# Patient Record
Sex: Male | Born: 1990 | Race: Black or African American | Hispanic: No | Marital: Single | Smoking: Current some day smoker
Health system: Southern US, Community
[De-identification: ages and names within clinical notes are randomized; demographics above are authoritative.]

## PROBLEM LIST (undated history)

## (undated) DIAGNOSIS — J302 Other seasonal allergic rhinitis: Secondary | ICD-10-CM

## (undated) HISTORY — PX: ANTERIOR CRUCIATE LIGAMENT REPAIR: SHX115

---
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
## 2013-03-23 ENCOUNTER — Encounter (HOSPITAL_COMMUNITY): Payer: Self-pay | Admitting: Emergency Medicine

## 2013-03-23 ENCOUNTER — Emergency Department (HOSPITAL_COMMUNITY)
Admission: EM | Admit: 2013-03-23 | Discharge: 2013-03-23 | Disposition: A | Discharge status: Home / Self Care | Payer: BC Managed Care – PPO | Attending: Emergency Medicine | Admitting: Emergency Medicine

## 2013-03-23 DIAGNOSIS — R252 Cramp and spasm: Secondary | ICD-10-CM

## 2013-03-23 DIAGNOSIS — F17200 Nicotine dependence, unspecified, uncomplicated: Secondary | ICD-10-CM | POA: Insufficient documentation

## 2013-03-23 DIAGNOSIS — J309 Allergic rhinitis, unspecified: Secondary | ICD-10-CM | POA: Insufficient documentation

## 2013-03-23 DIAGNOSIS — Z881 Allergy status to other antibiotic agents status: Secondary | ICD-10-CM | POA: Insufficient documentation

## 2013-03-23 DIAGNOSIS — M62838 Other muscle spasm: Secondary | ICD-10-CM | POA: Insufficient documentation

## 2013-03-23 MED ORDER — CYCLOBENZAPRINE HCL 10 MG PO TABS: 10.0000 mg | ORAL_TABLET | Freq: Two times a day (BID) | ORAL | Status: DC | PRN

## 2013-03-23 NOTE — ED Notes (Signed)
Discharge instructions reviewed. Pt verbalized understanding.  
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
## 2013-03-23 NOTE — ED Notes (Signed)
Pt denies any c/o at this time except leg heaviness. Pt alert and oriented.
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
## 2013-03-24 NOTE — ED Provider Notes (Signed)
Medical screening examination/treatment/procedure(s) were performed by non-physician practitioner and as supervising physician I was immediately available for consultation/collaboration.   Gavin Pound. Zafira Munos, MD 03/24/13 1116

## 2013-06-14 ENCOUNTER — Encounter (HOSPITAL_COMMUNITY): Payer: Self-pay | Admitting: Emergency Medicine

## 2013-06-14 ENCOUNTER — Emergency Department (HOSPITAL_COMMUNITY)
Admission: EM | Admit: 2013-06-14 | Discharge: 2013-06-14 | Disposition: A | Discharge status: Home / Self Care | Payer: BC Managed Care – PPO | Attending: Emergency Medicine | Admitting: Emergency Medicine

## 2013-06-14 ENCOUNTER — Emergency Department (HOSPITAL_COMMUNITY): Payer: BC Managed Care – PPO

## 2013-06-14 DIAGNOSIS — Y939 Activity, unspecified: Secondary | ICD-10-CM | POA: Insufficient documentation

## 2013-06-14 DIAGNOSIS — S0100XA Unspecified open wound of scalp, initial encounter: Secondary | ICD-10-CM | POA: Insufficient documentation

## 2013-06-14 DIAGNOSIS — R040 Epistaxis: Secondary | ICD-10-CM | POA: Insufficient documentation

## 2013-06-14 DIAGNOSIS — Y929 Unspecified place or not applicable: Secondary | ICD-10-CM | POA: Insufficient documentation

## 2013-06-14 DIAGNOSIS — Z23 Encounter for immunization: Secondary | ICD-10-CM | POA: Insufficient documentation

## 2013-06-14 DIAGNOSIS — R51 Headache: Secondary | ICD-10-CM | POA: Insufficient documentation

## 2013-06-14 DIAGNOSIS — F1010 Alcohol abuse, uncomplicated: Secondary | ICD-10-CM | POA: Insufficient documentation

## 2013-06-14 DIAGNOSIS — Z881 Allergy status to other antibiotic agents status: Secondary | ICD-10-CM | POA: Insufficient documentation

## 2013-06-14 DIAGNOSIS — F17200 Nicotine dependence, unspecified, uncomplicated: Secondary | ICD-10-CM | POA: Insufficient documentation

## 2013-06-14 DIAGNOSIS — S139XXA Sprain of joints and ligaments of unspecified parts of neck, initial encounter: Secondary | ICD-10-CM | POA: Insufficient documentation

## 2013-06-14 DIAGNOSIS — W268XXA Contact with other sharp object(s), not elsewhere classified, initial encounter: Secondary | ICD-10-CM | POA: Insufficient documentation

## 2013-06-14 DIAGNOSIS — J309 Allergic rhinitis, unspecified: Secondary | ICD-10-CM | POA: Insufficient documentation

## 2013-06-14 DIAGNOSIS — R413 Other amnesia: Secondary | ICD-10-CM | POA: Insufficient documentation

## 2013-06-14 DIAGNOSIS — T148XXA Other injury of unspecified body region, initial encounter: Secondary | ICD-10-CM | POA: Insufficient documentation

## 2013-06-14 DIAGNOSIS — R110 Nausea: Secondary | ICD-10-CM | POA: Insufficient documentation

## 2013-06-14 DIAGNOSIS — S060X0A Concussion without loss of consciousness, initial encounter: Secondary | ICD-10-CM | POA: Insufficient documentation

## 2013-06-14 DIAGNOSIS — F05 Delirium due to known physiological condition: Secondary | ICD-10-CM | POA: Insufficient documentation

## 2013-06-14 LAB — BASIC METABOLIC PANEL
BUN: 10 mg/dL (ref 6–23)
CO2: 26 mEq/L (ref 19–32)
Calcium: 8.6 mg/dL (ref 8.4–10.5)
Chloride: 105 mEq/L (ref 96–112)
GFR calc non Af Amer: 71 mL/min — ABNORMAL LOW (ref >90)
Glucose, Bld: 98 mg/dL (ref 70–99)
Potassium: 3.4 mEq/L — ABNORMAL LOW (ref 3.5–5.1)
Sodium: 141 mEq/L (ref 135–145)

## 2013-06-14 LAB — CBC WITH DIFFERENTIAL/PLATELET
Basophils Absolute: 0 10*3/uL (ref 0.0–0.1)
Eosinophils Absolute: 0.1 10*3/uL (ref 0.0–0.7)
Lymphs Abs: 2.6 10*3/uL (ref 0.7–4.0)
Neutrophils Relative %: 41 % — ABNORMAL LOW (ref 43–77)

## 2013-06-14 LAB — PROTIME-INR: INR: 1.05 (ref 0.00–1.49)

## 2013-06-14 MED ORDER — FENTANYL CITRATE 0.05 MG/ML IJ SOLN
INTRAMUSCULAR | Status: AC
  Filled 2013-06-14: qty 2

## 2013-06-14 MED ORDER — ONDANSETRON HCL 4 MG/2ML IJ SOLN
4.0000 mg | Freq: Once | INTRAMUSCULAR | Status: AC
  Administered 2013-06-14: 4 mg via INTRAVENOUS

## 2013-06-14 MED ORDER — FENTANYL CITRATE 0.05 MG/ML IJ SOLN
50.0000 ug | Freq: Once | INTRAMUSCULAR | Status: AC
  Administered 2013-06-14: 50 ug via INTRAVENOUS

## 2013-06-14 MED ORDER — KETOROLAC TROMETHAMINE 30 MG/ML IJ SOLN
30.0000 mg | Freq: Once | INTRAMUSCULAR | Status: AC
  Administered 2013-06-14: 30 mg via INTRAVENOUS
  Filled 2013-06-14: qty 1

## 2013-06-14 MED ORDER — ONDANSETRON HCL 4 MG/2ML IJ SOLN
INTRAMUSCULAR | Status: AC
  Filled 2013-06-14: qty 2

## 2013-06-14 MED ORDER — IBUPROFEN 600 MG PO TABS: 600.0000 mg | ORAL_TABLET | Freq: Four times a day (QID) | ORAL | Status: DC | PRN

## 2013-06-14 MED ORDER — HYDROCODONE-ACETAMINOPHEN 5-325 MG PO TABS: 1.0000 | ORAL_TABLET | Freq: Four times a day (QID) | ORAL | Status: DC | PRN

## 2013-06-14 MED ORDER — SODIUM CHLORIDE 0.9 % IV BOLUS (SEPSIS)
1000.0000 mL | Freq: Once | INTRAVENOUS | Status: AC
  Administered 2013-06-14: 1000 mL via INTRAVENOUS

## 2013-06-14 MED ORDER — HYDROCODONE-ACETAMINOPHEN 5-325 MG PO TABS
1.0000 | ORAL_TABLET | Freq: Once | ORAL | Status: AC
  Administered 2013-06-14: 1 via ORAL
  Filled 2013-06-14: qty 1

## 2013-06-14 MED ORDER — METHOCARBAMOL 500 MG PO TABS: 500.0000 mg | ORAL_TABLET | Freq: Two times a day (BID) | ORAL | Status: DC

## 2013-06-14 MED ORDER — SODIUM CHLORIDE 0.9 % IV SOLN
Freq: Once | INTRAVENOUS | Status: AC
  Administered 2013-06-14: 05:00:00 via INTRAVENOUS

## 2013-06-14 MED ORDER — METOCLOPRAMIDE HCL 5 MG/ML IJ SOLN
10.0000 mg | Freq: Once | INTRAMUSCULAR | Status: AC
  Administered 2013-06-14: 10 mg via INTRAVENOUS
  Filled 2013-06-14: qty 2

## 2013-06-14 MED ORDER — TETANUS-DIPHTH-ACELL PERTUSSIS 5-2.5-18.5 LF-MCG/0.5 IM SUSP
0.5000 mL | Freq: Once | INTRAMUSCULAR | Status: AC
  Administered 2013-06-14: 0.5 mL via INTRAMUSCULAR
  Filled 2013-06-14: qty 0.5

## 2013-06-14 NOTE — ED Notes (Signed)
Nausea returning, meds given for pain and nausea, IVF KVO, VSS, lying flat, c-collar remains. No changes. Family at [facility].
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
## 2013-06-14 NOTE — ED Notes (Signed)
MD at bedside. Backboard removed.

## 2013-06-14 NOTE — ED Notes (Signed)
Dr. Rhunette Croft speaking with pt and pt's mother, updated. 3 staples placed to top of head.

## 2013-06-14 NOTE — ED Notes (Signed)
Mother and girlfriend at bedside.

## 2013-06-14 NOTE — ED Notes (Signed)
c-collar removed by Dr. Rhunette Croft, EDP at [facility], preparing to staple head.
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
## 2014-02-11 ENCOUNTER — Emergency Department (HOSPITAL_COMMUNITY)
Admission: EM | Admit: 2014-02-11 | Discharge: 2014-02-11 | Disposition: A | Discharge status: Home / Self Care | Payer: BC Managed Care – PPO | Attending: Emergency Medicine | Admitting: Emergency Medicine

## 2014-02-11 ENCOUNTER — Encounter (HOSPITAL_COMMUNITY): Payer: Self-pay | Admitting: Emergency Medicine

## 2014-02-11 DIAGNOSIS — H9209 Otalgia, unspecified ear: Secondary | ICD-10-CM | POA: Insufficient documentation

## 2014-02-11 DIAGNOSIS — F17200 Nicotine dependence, unspecified, uncomplicated: Secondary | ICD-10-CM | POA: Insufficient documentation

## 2014-02-11 DIAGNOSIS — Z880 Allergy status to penicillin: Secondary | ICD-10-CM | POA: Insufficient documentation

## 2014-02-11 DIAGNOSIS — Z79899 Other long term (current) drug therapy: Secondary | ICD-10-CM | POA: Insufficient documentation

## 2014-02-11 DIAGNOSIS — R599 Enlarged lymph nodes, unspecified: Secondary | ICD-10-CM | POA: Insufficient documentation

## 2014-02-11 DIAGNOSIS — R112 Nausea with vomiting, unspecified: Secondary | ICD-10-CM | POA: Insufficient documentation

## 2014-02-11 DIAGNOSIS — J029 Acute pharyngitis, unspecified: Secondary | ICD-10-CM | POA: Insufficient documentation

## 2014-02-11 LAB — RAPID STREP SCREEN (MED CTR MEBANE ONLY): Streptococcus, Group A Screen (Direct): NEGATIVE

## 2014-02-11 NOTE — ED Provider Notes (Signed)
CSN: [id number]     Arrival date & time 02/11/14  1903 History   First MD Initiated Contact with Patient 02/11/14 1912     Chief Complaint  Patient presents with  . Sore Throat    3 day hx of sore throat  . Lymphadenopathy    "swelling on r/side of neck"     (Consider location/radiation/quality/duration/timing/severity/associated sxs/prior Treatment) HPI Comments: Patrick Cole is a 23 y.o. Male with a PMHx of seasonal allergies, who presents with complaints of sore throat x3 days and R sided LAD. Pain is moderate, non-radiating, localized to R side of throat. Aggravated by swallowing and eating/drinking, better with salt water gargles and dayquil. Endorses some R ear discomfort, but no discharge or change in hearing. 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 +sick contacts, his roommate stated he was feeling ill last week. No recent travel. Smokes occasionally. Allergic to amoxicillin.  Patient is a 23 y.o. male presenting with pharyngitis. The history is provided by the patient. No language interpreter was used.  Sore Throat This is a new problem. The current episode started in the past 7 days. The problem occurs constantly. The problem has been unchanged. Associated symptoms include nausea (x1, after exertion), a sore throat, swollen glands and vomiting (x1, after exertion on Tuesday). Pertinent negatives include no abdominal pain, anorexia, arthralgias, chest pain, chills, congestion, coughing, fatigue, fever, headaches, joint swelling, myalgias, neck pain, numbness, rash, urinary symptoms or weakness. The symptoms are aggravated by drinking, eating and swallowing. Treatments tried: salt water gargles, dayquil. The treatment provided mild relief.    Past Medical History  Diagnosis Date  . Seasonal  allergies    Past Surgical History  Procedure Laterality Date  . Anterior cruciate ligament repair    . Anterior cruciate ligament repair     History reviewed. No pertinent family history. History  Substance Use Topics  . Smoking status: Current Some Day Smoker  . Smokeless tobacco: Not on file  . Alcohol Use: Yes    Review of Systems  Constitutional: Negative for fever, chills and fatigue.  HENT: Positive for ear pain and sore throat. Negative for congestion, drooling, ear discharge, hearing loss, mouth sores, nosebleeds, postnasal drip, rhinorrhea, sinus pressure, sneezing, tinnitus, trouble swallowing and voice change.   Eyes: Negative for pain, discharge, redness, itching and visual disturbance.  Respiratory: Negative for cough and shortness of breath.   Cardiovascular: Negative for chest pain.  Gastrointestinal: Positive for nausea (x1, after exertion) and vomiting (x1, after exertion on Tuesday). Negative for abdominal pain, diarrhea, constipation and anorexia.  Musculoskeletal: Negative for arthralgias, joint swelling, myalgias and neck pain.  Skin: Negative for rash.  Neurological: Negative for dizziness, weakness, numbness and headaches.      Allergies  Amoxicillin  Home Medications   Prior to Admission medications   Medication Sig Start Date End Date Taking? Authorizing Provider  HYDROcodone-acetaminophen (NORCO/VICODIN) 5-325 MG per tablet Take 1 tablet by mouth every 6 (six) hours as needed for pain. 06/14/13   Derwood Kaplan, MD  ibuprofen (ADVIL,MOTRIN) 600 MG tablet Take 1 tablet (600 mg total) by mouth every 6 (six) hours as needed for pain. 06/14/13   Derwood Kaplan, MD  methocarbamol (ROBAXIN) 500 MG tablet Take 1 tablet (500 mg total) by mouth 2 (two) times daily. 06/14/13   Derwood Kaplan, MD   BP 115/67  Pulse  60  Temp(Src) 99.1 F (37.3 C) (Oral)  Resp 16  Wt 207 lb (93.895 kg)  SpO2 100% Physical Exam  Nursing note and vitals  reviewed. Constitutional: He is oriented to person, place, and time. Vital signs are normal. He appears well-developed and well-nourished. No distress.  HENT:  Head: Normocephalic and atraumatic.  Right Ear: External ear and ear canal normal. No drainage. Tympanic membrane is not injected and not erythematous. A middle ear effusion is present. No decreased hearing is noted.  Left Ear: External ear and ear canal normal. No drainage. Tympanic membrane is not injected and not erythematous. A middle ear effusion is present. No decreased hearing is noted.  Nose: Mucosal edema present. No rhinorrhea. Right sinus exhibits no maxillary sinus tenderness and no frontal sinus tenderness. Left sinus exhibits no maxillary sinus tenderness and no frontal sinus tenderness.  Mouth/Throat: Uvula is midline and mucous membranes are normal. No trismus in the jaw. No uvula swelling. Oropharyngeal exudate and posterior oropharyngeal erythema present. No posterior oropharyngeal edema or tonsillar abscesses.  [place]/AT. B/L TM's with clear effusion, no loss of landmarks or erythema noted, canals clear. Mild nasal turbinate swelling bilaterally, erythematous with no rhinorrhea. Sinuses non-TTP bilaterally. Uvula midline, no trismus or swelling. No peritonsillar abscess. Posterior pharynx injected with trace exudate on R tonsil. MMM.  Eyes: Conjunctivae and EOM are normal. Pupils are equal, round, and reactive to light. Right eye exhibits no discharge. Left eye exhibits no discharge.  Neck: Normal range of motion. Neck supple. No spinous process tenderness and no muscular tenderness present. No rigidity. Normal range of motion present.  Cardiovascular: Normal rate, regular rhythm, normal heart sounds and intact distal pulses.  Exam reveals no friction rub.   No murmur heard. Pulmonary/Chest: Effort normal and breath sounds normal. He has no wheezes. He has no rhonchi. He has no rales.  CTAB anteriorly and posteriorly  Abdominal:  Soft. Normal appearance and bowel sounds are normal. He exhibits no distension. There is no tenderness. There is no rigidity, no rebound and no guarding.  Musculoskeletal: Normal range of motion.  Lymphadenopathy:       Head (right side): Submandibular and tonsillar adenopathy present. No submental, no preauricular, no posterior auricular and no occipital adenopathy present.       Head (left side): Submandibular adenopathy present. No submental, no tonsillar, no preauricular, no posterior auricular and no occipital adenopathy present.    He has cervical adenopathy.       Right cervical: Superficial cervical adenopathy present. No deep cervical and no posterior cervical adenopathy present.      Left cervical: Superficial cervical adenopathy present. No deep cervical and no posterior cervical adenopathy present.       Right: No supraclavicular adenopathy present.       Left: No supraclavicular adenopathy present.  Submandibular and shotty superficial cervical LAD bilaterally, non-TTP. Tonsillar LAD on R side, TTP.  No pre/post-auricular LAD, no occipital LAD.   Neurological: He is alert and oriented to person, place, and time.  Skin: Skin is warm, dry and intact. No rash noted.  Psychiatric: He has a normal mood and affect.    ED Course  Procedures (including critical care time) Labs Review Labs Reviewed  RAPID STREP SCREEN  CULTURE, GROUP A STREP    Imaging Review No results found.   EKG Interpretation None      MDM   Final diagnoses:  Acute pharyngitis, unspecified pharyngitis type    Holland CommonsGerald Napolitano is a 23 y.o. male with  seasonal allergies who presents with 3 days of sore throat and LAD. Tonsillar erythema with trace exudates, temperature 99.5F here. Clear lung exam. Low clinical suspicion of peritonsillar abscess. DDx includes strep or mononucleosis, although pt not fatigued and no posterior cervical LAD. RST negative, discussed with pt that if culture comes back positive the  lab will call him to give him abx. I believe this is likely viral pharyngitis. Discussed symptomatic treatment with close follow up with PCP as needed but spoke at length about emergent changing or worsening of symptoms that should prompt return to ER. Pt voices understanding and is agreeable to plan. Stable for d/c.  BP 115/67  Pulse 60  Temp(Src) 99.1 F (37.3 C) (Oral)  Resp 16  Wt 207 lb (93.895 kg)  SpO2 100%      Donnita FallsMercedes Strupp Camprubi-Soms, PA-C 02/11/14 [address]2010  Mercedes Strupp Bay[place]prubi-Soms, [place]PA-C 02/11/14 2010

## 2014-02-11 NOTE — Discharge Instructions (Signed)
Continue to stay well-hydrated. Gargle warm salt water and spit it out. Continue to alternate between Tylenol and Ibuprofen for pain or fever. May consider over-the-counter Benadryl for additional relief. Use Chloraseptic spray as needed for sore throat. Followup with your primary care doctor in 5-7 days for recheck of ongoing symptoms that return to emergency department for emergent changing or worsening of symptoms.  Take benadryl or other antihistamine to decrease secretions and for watery itchy eyes.   Use Mucinex for cough suppression/expectoration of mucus.   Pharyngitis Pharyngitis is redness, pain, and swelling (inflammation) of your pharynx.  CAUSES  Pharyngitis is usually caused by infection. Most of the time, these infections are from viruses (viral) and are part of a cold. However, sometimes pharyngitis is caused by bacteria (bacterial). Pharyngitis can also be caused by allergies. Viral pharyngitis may be spread from person to person by coughing, sneezing, and personal items or utensils (cups, forks, spoons, toothbrushes). Bacterial pharyngitis may be spread from person to person by more intimate contact, such as kissing.  SIGNS AND SYMPTOMS  Symptoms of pharyngitis include:   Sore throat.   Tiredness (fatigue).   Low-grade fever.   Headache.  Joint pain and muscle aches.  Skin rashes.  Swollen lymph nodes.  Plaque-like film on throat or tonsils (often seen with bacterial pharyngitis). DIAGNOSIS  Your health care provider will ask you questions about your illness and your symptoms. Your medical history, along with a physical exam, is often all that is needed to diagnose pharyngitis. Sometimes, a rapid strep test is done. Other lab tests may also be done, depending on the suspected cause.  TREATMENT  Viral pharyngitis will usually get better in 3-4 days without the use of medicine. Bacterial pharyngitis is treated with medicines that kill germs (antibiotics).  HOME  CARE INSTRUCTIONS   Drink enough water and fluids to keep your urine clear or pale yellow.   Only take over-the-counter or prescription medicines as directed by your health care provider:   If you are prescribed antibiotics, make sure you finish them even if you start to feel better.   Do not take aspirin.   Get lots of rest.   Gargle with 8 oz of salt water ( tsp of salt per 1 qt of water) as often as every 1-2 hours to soothe your throat.   Throat lozenges (if you are not at risk for choking) or sprays may be used to soothe your throat. SEEK MEDICAL CARE IF:   You have large, tender lumps in your neck.  You have a rash.  You cough up green, yellow-brown, or bloody spit. SEEK IMMEDIATE MEDICAL CARE IF:   Your neck becomes stiff.  You drool or are unable to swallow liquids.  You vomit or are unable to keep medicines or liquids down.  You have severe pain that does not go away with the use of recommended medicines.  You have trouble breathing (not caused by a stuffy nose). MAKE SURE YOU:   Understand these instructions.  Will watch your condition.  Will get help right away if you are not doing well or get worse. Document Released: 08/06/2005 Document Revised: 05/27/2013 Document Reviewed: 04/13/2013 [facility] Patient Information 2015 [place], [place]. This information is not intended to replace advice given to you by your health care provider. Make sure you discuss any questions you have with your health care provider.  Salt Water Gargle This solution will help make your mouth and throat feel better. HOME CARE INSTRUCTIONS   Mix  1 teaspoon of salt in 8 ounces of warm water.  Gargle with this solution as much or often as you need or as directed. Swish and gargle gently if you have any sores or wounds in your mouth.  Do not swallow this mixture. Document Released: 05/10/2004 Document Revised: 10/29/2011 Document Reviewed: 10/01/2008 [facility]Exit[facility] Patient Information  2015 [place], [place]. This information is not intended to replace advice given to you by your health care provider. Make sure you discuss any questions you have with your health care provider.

## 2014-02-13 LAB — CULTURE, GROUP A STREP

## 2014-02-14 NOTE — ED Provider Notes (Signed)
Medical screening examination/treatment/procedure(s) were performed by non-physician practitioner and as supervising physician I was immediately available for consultation/collaboration.   EKG Interpretation None       Hurman HornJohn M Bednar, MD 02/14/14 2101

## 2015-03-30 ENCOUNTER — Observation Stay (HOSPITAL_COMMUNITY)
Admission: EM | Admit: 2015-03-30 | Discharge: 2015-04-01 | Disposition: A | Discharge status: Home / Self Care | Payer: Self-pay | Attending: Internal Medicine | Admitting: Internal Medicine

## 2015-03-30 ENCOUNTER — Encounter (HOSPITAL_COMMUNITY): Payer: Self-pay | Admitting: Emergency Medicine

## 2015-03-30 DIAGNOSIS — M6282 Rhabdomyolysis: Principal | ICD-10-CM

## 2015-03-30 DIAGNOSIS — Z880 Allergy status to penicillin: Secondary | ICD-10-CM | POA: Insufficient documentation

## 2015-03-30 DIAGNOSIS — M79671 Pain in right foot: Secondary | ICD-10-CM

## 2015-03-30 DIAGNOSIS — R252 Cramp and spasm: Secondary | ICD-10-CM

## 2015-03-30 DIAGNOSIS — M79672 Pain in left foot: Secondary | ICD-10-CM | POA: Insufficient documentation

## 2015-03-30 DIAGNOSIS — N179 Acute kidney failure, unspecified: Secondary | ICD-10-CM

## 2015-03-30 DIAGNOSIS — F17210 Nicotine dependence, cigarettes, uncomplicated: Secondary | ICD-10-CM | POA: Insufficient documentation

## 2015-03-30 MED ORDER — SODIUM CHLORIDE 0.9 % IV BOLUS (SEPSIS)
1000.0000 mL | Freq: Once | INTRAVENOUS | Status: AC
  Administered 2015-03-30: 1000 mL via INTRAVENOUS

## 2015-03-30 NOTE — ED Provider Notes (Signed)
CSN: [id number]     Arrival date & time 03/30/15  2217 History   First MD Initiated Contact with Patient 03/30/15 2221     Chief Complaint  Patient presents with  . Muscle Pain   HPI   24 year old male presents today with muscle cramps. 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 Patient reports he got into an ice bath this evening after his last practice and had large muscle cramps in his quads lotion M.D. and abdomen. He reports symptom lasted approximately 40 minutes. Patient reports similar symptoms approximately one year ago, same time. Patient denies dizziness, fever, chills, chest pain, shortness of breath, change in his urine color clarity or characteristics.  Past Medical History  Diagnosis Date  . Seasonal allergies    Past Surgical History  Procedure Laterality Date  . Anterior cruciate ligament repair    . Anterior cruciate ligament repair     History reviewed. No pertinent family history. Social History  Substance Use Topics  . Smoking status: Current Some Day Smoker  . Smokeless tobacco: None  . Alcohol Use: No     Comment: "not during camp"    Review of Systems  All other systems reviewed and are negative.   Allergies  Amoxicillin  Home Medications   Prior to Admission medications   Medication Sig Start Date End Date Taking? Authorizing Provider  HYDROcodone-acetaminophen (NORCO/VICODIN) 5-325 MG per tablet Take 1 tablet by mouth every 6 (six) hours as needed for pain. 06/14/13   Derwood Kaplan, MD  ibuprofen (ADVIL,MOTRIN) 600 MG tablet Take 1 tablet (600 mg total) by mouth every 6 (six) hours as needed for pain. 06/14/13   Derwood Kaplan, MD  methocarbamol (ROBAXIN) 500 MG tablet Take 1 tablet (500 mg total) by mouth 2 (two) times daily. 06/14/13   Ankit Nanavati, MD   BP 119/64 mmHg  Pulse 64  Resp 18  SpO2 99%   Physical Exam  Constitutional: He is  oriented to person, place, and time. He appears well-developed and well-nourished.  Patient is in no acute distress at the time my evaluation  HENT:  Head: Normocephalic and atraumatic.  Eyes: Conjunctivae are normal. Pupils are equal, round, and reactive to light. Right eye exhibits no discharge. Left eye exhibits no discharge. No scleral icterus.  Neck: Normal range of motion. No JVD present. No tracheal deviation present.  Pulmonary/Chest: Effort normal. No stridor.  Musculoskeletal:  Patient has some minor tenderness to palpation his quadriceps bilateral, no obvious swelling or deformities. He has full active range of motion of all major joints, 5 out of 5 strength sensation grossly intact. No active spasm. Patient ambulates without difficulty.  Neurological: He is alert and oriented to person, place, and time. Coordination normal.  Psychiatric: He has a normal mood and affect. His behavior is normal. Judgment and thought content normal.  Nursing note and vitals reviewed.   ED Course  Procedures (including critical care time) Labs Review Labs Reviewed  BASIC METABOLIC PANEL - Abnormal; Notable for the following:    Creatinine, Ser 1.61 (*)    GFR calc non Af Amer 59 (*)    All other components within normal limits  CK - Abnormal; Notable for the following:    Total CK 7236 (*)    All other components within normal limits  URINALYSIS, ROUTINE W REFLEX MICROSCOPIC (NOT AT [facility]) - Abnormal; Notable for the following:  Ketones, ur 15 (*)    All other components within normal limits    Imaging Review No results found.   EKG Interpretation   Date/Time:  Wednesday March 30 2015 22:26:26 EDT Ventricular Rate:  67 PR Interval:  181 QRS Duration: 90 QT Interval:  418 QTC Calculation: 441 R Axis:   81 Text Interpretation:  Sinus rhythm Consider left atrial enlargement Since  last tracing rate slower Confirmed by MILLER  MD, BRIAN ([id number]) on  03/30/2015 11:00:13 PM      MDM    Final diagnoses:  Muscle cramps    Labs: BMP, CK, urinalysis- significant for creatinine 1.61, CK 7236  Imaging:  Consults:  Therapeutics: Normal saline  Discharge Meds:   Assessment/Plan: Patient presents today with muscle cramps. 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 Patient was asymptomatic at the time of arrival to the emergency room, he was in no acute distress. Patient was given 1500 mL of normal saline, CK came back at 7236. Patient given another 1500 mL of normal saline, will recheck CK after that. Patient signed out at shift change to Earley Favor NP at shift change pending fluid in lab recheck.         Eyvonne Mechanic, PA-C 03/31/15 0134  Eber Hong, MD 03/31/15 [phone]

## 2015-03-30 NOTE — ED Notes (Signed)
Per [facility], pt was training hard today at football when he began to experience muscle cramping.  Hx of [place]. 2 years ago, "feels the same".  Trainers were icing him down when EMS arrived.  No other medical hx.

## 2015-03-31 ENCOUNTER — Observation Stay (HOSPITAL_COMMUNITY): Payer: BLUE CROSS/BLUE SHIELD

## 2015-03-31 DIAGNOSIS — E860 Dehydration: Secondary | ICD-10-CM

## 2015-03-31 DIAGNOSIS — R252 Cramp and spasm: Secondary | ICD-10-CM | POA: Insufficient documentation

## 2015-03-31 DIAGNOSIS — N179 Acute kidney failure, unspecified: Secondary | ICD-10-CM

## 2015-03-31 DIAGNOSIS — M6282 Rhabdomyolysis: Secondary | ICD-10-CM | POA: Diagnosis present

## 2015-03-31 LAB — BASIC METABOLIC PANEL
Anion gap: 10 (ref 5–15)
BUN: 19 mg/dL (ref 6–20)
CO2: 25 mmol/L (ref 22–32)
Calcium: 9 mg/dL (ref 8.9–10.3)
Chloride: 103 mmol/L (ref 101–111)
Creatinine, Ser: 1.61 mg/dL — ABNORMAL HIGH (ref 0.61–1.24)
GFR calc Af Amer: >60 mL/min (ref >60)
GFR calc non Af Amer: 59 mL/min — ABNORMAL LOW (ref >60)
Glucose, Bld: 84 mg/dL (ref 65–99)
Potassium: 4.2 mmol/L (ref 3.5–5.1)
Sodium: 138 mmol/L (ref 135–145)

## 2015-03-31 LAB — URINALYSIS, ROUTINE W REFLEX MICROSCOPIC
Bilirubin Urine: NEGATIVE
Glucose, UA: NEGATIVE mg/dL
Hgb urine dipstick: NEGATIVE
Ketones, ur: 15 mg/dL — AB
Leukocytes, UA: NEGATIVE
Nitrite: NEGATIVE
Protein, ur: NEGATIVE mg/dL
Specific Gravity, Urine: 1.028 (ref 1.005–1.030)
Urobilinogen, UA: 1 mg/dL (ref 0.0–1.0)
pH: 5.5 (ref 5.0–8.0)

## 2015-03-31 LAB — CK
CK TOTAL: 5190 U/L — AB (ref 49–397)
Total CK: 7236 U/L — ABNORMAL HIGH (ref 49–397)

## 2015-03-31 MED ORDER — SODIUM CHLORIDE 0.9 % IV BOLUS (SEPSIS)
1500.0000 mL | Freq: Once | INTRAVENOUS | Status: AC
  Administered 2015-03-31: 1000 mL via INTRAVENOUS

## 2015-03-31 MED ORDER — ENOXAPARIN SODIUM 40 MG/0.4ML ~~LOC~~ SOLN
40.0000 mg | SUBCUTANEOUS | Status: DC
  Filled 2015-03-31: qty 0.4

## 2015-03-31 MED ORDER — SODIUM CHLORIDE 0.9 % IV BOLUS (SEPSIS)
1000.0000 mL | Freq: Once | INTRAVENOUS | Status: AC
  Administered 2015-03-31: 1000 mL via INTRAVENOUS

## 2015-03-31 MED ORDER — SODIUM CHLORIDE 0.9 % IV SOLN
INTRAVENOUS | Status: AC
  Administered 2015-03-31 – 2015-04-01 (×4): via INTRAVENOUS

## 2015-03-31 MED ORDER — ACETAMINOPHEN 325 MG PO TABS: 650.0000 mg | ORAL_TABLET | Freq: Four times a day (QID) | ORAL | Status: DC | PRN

## 2015-03-31 MED ORDER — ACETAMINOPHEN 650 MG RE SUPP: 650.0000 mg | Freq: Four times a day (QID) | RECTAL | Status: DC | PRN

## 2015-03-31 NOTE — ED Notes (Signed)
Pt made aware of bed asiignment

## 2015-03-31 NOTE — ED Notes (Signed)
Breakfast tray ordered  reg diet.

## 2015-03-31 NOTE — Progress Notes (Signed)
Assumed care of pt from Patrick Cole. Pt states no complaints no distress. Resting-watching TV.

## 2015-03-31 NOTE — H&P (Signed)
Date: 03/31/2015               Patient Name:  Patrick Cole MRN: [id number]  DOB: June 02, 1991 Age / Sex: 24 y.o., male   PCP: No Pcp Per Patient         Medical Service: Internal Medicine Teaching Service         Attending Physician: Dr. Derwood Kaplan, MD    First Contact: Dr. [name] Pager: [phone]  Second Contact: Dr. Andrey Campanile Pager: [phone]       After Hours (After 5p/  First Contact Pager: [phone]  weekends / holidays): Second Contact Pager: [phone]   Chief Complaint: Body pains  History of Present Illness: 14 Y O M with no significant PMH except for allergies which required shots in the past and tobacco abuse, presented to the ED last night, with complaints of muscle cramps and pain mostly in his lower extremities, but also in his supper extremities. Patient played football twice yesterday, under the hot weather, in the morning and evening. He says he tried to stay hydrated. He endorses good appetite, he denies chest pain, no diarrhea or vomiting. He denies dysuria or dark urine. He denies use of any illicit drugs, he occasionally drinks alcohol only when he goes to parties, he is not on any medications. Presently patient is no longer having cramps and describes only mild pain in his lower extremities. In the ED, Pts CK levels- 7236, he was hydrated, given 3.5L of IVF, with CK dropping to 5190. [facility]MT[facility] was called to admit.  Meds: No current facility-administered medications for this encounter.   Current Outpatient Prescriptions  Medication Sig Dispense Refill  . HYDROcodone-acetaminophen (NORCO/VICODIN) 5-325 MG per tablet Take 1 tablet by mouth every 6 (six) hours as needed for pain. 15 tablet 0  . ibuprofen (ADVIL,MOTRIN) 600 MG tablet Take 1 tablet (600 mg total) by mouth every 6 (six) hours as needed for pain. 30 tablet 0  . methocarbamol (ROBAXIN) 500 MG tablet Take 1 tablet (500 mg total) by mouth 2 (two) times daily. 20 tablet 0    Allergies: Allergies as of 03/30/2015  - Review Complete 03/30/2015  Allergen Reaction Noted  . Amoxicillin Hives 07/17/2011   Past Medical History  Diagnosis Date  . Seasonal allergies    Past Surgical History  Procedure Laterality Date  . Anterior cruciate ligament repair    . Anterior cruciate ligament repair     History reviewed. No pertinent family history. Social History   Social History  . Marital Status: Single    Spouse Name: N/A  . Number of Children: N/A  . Years of Education: N/A   Occupational History  . Not on file.   Social History Main Topics  . Smoking status: Current Some Day Smoker  . Smokeless tobacco: Not on file  . Alcohol Use: No     Comment: "not during camp"  . Drug Use: No  . Sexual Activity: Not on file   Other Topics Concern  . Not on file   Social History Narrative    Review of Systems: CONSTITUTIONAL- No Fever, weightloss, night sweat or change in appetite. SKIN- No Rash. HEAD- No Headache or dizziness. RESPIRATORY- No Cough or SOB. CARDIAC- No Palpitations, or chest pain. GI- No vomiting, diarrhoea, abd pain. URINARY- No Frequency, urgency, straining or dysuria.  Physical Exam: Blood pressure 122/67, pulse 58, resp. rate 22, SpO2 99 %. GENERAL- alert, co-operative, appears as stated age, not in any distress, eating breakfast. HEENT- Atraumatic,  normocephalic, PERRL, EOMI, oral mucosa appears moist CARDIAC- RRR, no murmurs, rubs or gallops. RESP- Moving equal volumes of air, and clear to auscultation bilaterally, no wheezes or crackles. ABDOMEN- Soft, nontender, no palpable masses or organomegaly, bowel sounds present. NEURO- No obvious Cr N abnormality, strenght upper and lower extremities- 5/5 EXTREMITIES- pulse 2+, symmetric, no pedal edema. SKIN- Warm, dry, No rash or lesion. PSYCH- Normal mood and affect, appropriate thought content and speech.  Lab results: Basic Metabolic Panel:  Recent Labs  16/10/96 2258  NA 138  K 4.2  CL 103  CO2 25  GLUCOSE 84    BUN 19  CREATININE 1.61*  CALCIUM 9.0   Cardiac Enzymes:  Recent Labs  03/30/15 2258 03/31/15 0524  CKTOTAL 7236* 5190*   Urinalysis:  Recent Labs  03/30/15 2030  COLORURINE YELLOW  LABSPEC 1.028  PHURINE 5.5  GLUCOSEU NEGATIVE  HGBUR NEGATIVE  BILIRUBINUR NEGATIVE  KETONESUR 15*  PROTEINUR NEGATIVE  UROBILINOGEN 1.0  NITRITE NEGATIVE  LEUKOCYTESUR NEGATIVE   Other results: EKG: Rate- 67bpm, regular, sinus rhythm, PR interval- 181, no PR interval abnormalities, insignificant Q wvaes in III, III, AVF, Normal Axis, with J point elevation- V3, V5. Similar tracing compared to prior.  Assessment & Plan by Problem: Principal Problem:   Rhabdomyolysis  Rhabdomyolysis- Likely due to exertion without adequate hydration. CK on admission has trended down form 7236 to 5190. S/p 3.5L of IVF. Will likely continue to trend down, as patient is taking PO. Pt also has some slight AKI- Cr- 1.6, but appears baseline Cr elevated at 1.4, likely due to muscle mass. - Admit to med-surg- D/c in the am. - IVF n/s 150cc/hr for 1 day - Regular diet - CK am - Bmet am - HIV. - Tylenol for pain - D/c in the am.   DVT ppx- Lovenox.  Dispo: Disposition is deferred at this time, awaiting improvement of current medical problems. Anticipated discharge in approximately tomorrow.   The patient does not have a current PCP (No Pcp Per Patient) and does not need an [facility] hospital follow-up appointment after discharge.  The patient does not know have transportation limitations that hinder transportation to clinic appointments.  Signed: Onnie Boer, MD 03/31/2015, 8:53 AM

## 2015-03-31 NOTE — ED Provider Notes (Signed)
PROGRESS NOTE                                                                                                                 This is a sign-out from NP Schultzat shift change: Patrick Cole is a 24 y.o. male presenting with muscle cramping after football practice. Patient is found to have rhabdomyolysis with a CK greater than 7000 and acute kidney injury with a creatinine of 1.61. His second time he's had rhabdo. Patient has received 4 L of fluid, plan is to recheck his CK and if it's less than 1000 he can go home.\Please refer to previous note for full HPI, ROS, PMH and PE.   This is a shared visit with the attending physician who personally evaluated the patient and agrees with the care plan.    Recheck of CK is still significantly elevated at 5190. Patient will need admission. This will be an unassigned admission to internal medicine Dr. Mariea Clonts.  Wynetta Emery, PA-C 03/31/15 6213  Eber Hong, MD 03/31/15 [phone]

## 2015-03-31 NOTE — ED Notes (Signed)
Contact Patrick Cole [phone] when pt is ready for dc.

## 2015-04-01 DIAGNOSIS — M6282 Rhabdomyolysis: Secondary | ICD-10-CM

## 2015-04-01 LAB — BASIC METABOLIC PANEL
ANION GAP: 7 (ref 5–15)
BUN: 7 mg/dL (ref 6–20)
CALCIUM: 8.3 mg/dL — AB (ref 8.9–10.3)
CHLORIDE: 107 mmol/L (ref 101–111)
CO2: 25 mmol/L (ref 22–32)
Creatinine, Ser: 1.13 mg/dL (ref 0.61–1.24)
Glucose, Bld: 89 mg/dL (ref 65–99)
Potassium: 4.5 mmol/L (ref 3.5–5.1)
Sodium: 139 mmol/L (ref 135–145)

## 2015-04-01 LAB — HIV ANTIBODY (ROUTINE TESTING W REFLEX): HIV SCREEN 4TH GENERATION: NONREACTIVE

## 2015-04-01 LAB — CK: CK TOTAL: 2990 U/L — AB (ref 49–397)

## 2015-04-01 NOTE — Progress Notes (Signed)
Patient ID: Patrick Cole, male   DOB: 1991/07/26, 24 y.o.   MRN: [id number]   Subjective: Patrick Cole said the pain in his lower legs was much-improved today and he is ready to go home. He said he will take it easy getting back to practice and work harder at staying hydrated. He has no complaints. During his knee surgery a few years back, he denies any problems suggestive of malignant hyperthermia.  Objective: Vital signs in last 24 hours: Filed Vitals:   03/31/15 1100 03/31/15 1155 03/31/15 2143 04/01/15 0533  BP: 116/60 111/56 121/63 96/46  Pulse: 53 63 53 47  Temp:  98.3 F (36.8 C) 98.2 F (36.8 C) 97.9 F (36.6 C)  TempSrc:  Oral Oral Oral  Resp:  Height:   (1.854 m)    Weight:  101.379 kg (223 lb 8 oz)    SpO2: 98% 100% 100% 100%   General: resting in bed watching television HEENT: no scleral icterus Cardiac: RRR, no rubs, murmurs or gallops Pulm: clear to auscultation bilaterally, moving normal volumes of air Ext: warm and well perfused, no pedal edema  Lab Results: Basic Metabolic Panel:  Recent Labs Lab 03/30/15 2258 04/01/15 0631  NA 138 139  K 4.2 4.5  CL 103 107  CO2 25 25  GLUCOSE 84 89  BUN 19 7  CREATININE 1.61* 1.13  CALCIUM 9.0 8.3*    Recent Labs Lab 03/30/15 2258 03/31/15 0524 04/01/15 0631  CKTOTAL 7236* 5190* 2990*   Studies/Results: Dg Foot 2 Views Right  04/01/2015   CLINICAL DATA:  Three-day history of pain and cramping.  EXAM: RIGHT FOOT - 2 VIEW  COMPARISON:  None.  FINDINGS: Frontal and lateral views obtained. No fracture or dislocation. Joint spaces appear intact. No erosive change. There is pes planus. No soft tissue lesions appreciable.  IMPRESSION: Pes planus. No fracture or dislocation. No appreciable arthropathy.   Electronically Signed   By: Bretta Bang III M.D.   On: 04/01/2015 00:43   Medications: I have reviewed the patient's current medications. Scheduled Meds: . enoxaparin (LOVENOX) injection  40 mg  Subcutaneous Q24H   Continuous Infusions:  PRN Meds:.acetaminophen **OR** acetaminophen   Assessment/Plan:  Rhabdomyolysis: His leg pain is much improved after getting fluids, his creatinine kinase is now 2990 (down from 7236 on admission), and his creatinine has improved from 1.61 on admission to 1.13 which is below his baseline of 1.2. 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 I still suspect there may be an underlying enzyme defect but he denies any family history suggestive of McArdle's or other problems. I suggested him to stay hydrated when practicing and playing football at [employer]. He is clear for discharge today.  Left foot pain: When I saw Patrick Cole last night, he complained of acute onset left foot pain and swelling that was tender to palpation over the dorsum of his medial forefoot. The x-ray was negative for fracture, only notable for pes planus. I recommended he rest, ice, compress, and elevate his foot. He will have to rest to recover from his rhabdomyolysis anyway so I am hoping that will be enough time for his tendonous damage to repair itself.  Dispo: Discharge today.  The patient does have a current PCP (Dr. Earnest Conroy) and does need an [facility] hospital follow-up  appointment after discharge.  The patient does not have transportation limitations that hinder transportation to clinic appointments.  .Services Needed at time of discharge: Y = Yes, Blank = No PT:   OT:   RN:   Equipment:   Other:       Patrick Cooley, MD 04/01/2015, 11:36 AM

## 2015-04-01 NOTE — Discharge Instructions (Signed)
Rhabdomyolysis °Rhabdomyolysis is the breakdown of muscle fibers due to injury. The injury may come from physical damage to the muscle like an injury but other causes are: °· High fever (hyperthermia). °· Seizures (convulsions). °· Low phosphate levels. °· Diseases of metabolism. °· Heatstroke. °· Drug toxicity. °· Over exertion. °· Alcoholism. °· Muscle is cut off from oxygen (anoxia). °· The squeezing of nerves and blood vessels (compartment syndrome). °Some drugs which may cause the breakdown of muscle are: °· Antibiotics. °· Statins. °· Alcohol. °· Animal toxins. °Myoglobin is a substance which helps muscle use oxygen. When the muscle is damaged, the myoglobin is released into the bloodstream. It is filtered out of the bloodstream by the kidneys. Myoglobin may block up the kidneys. This may cause damage, such as kidney failure. It also breaks down into other damaging toxic parts, which also cause kidney failure.  °SYMPTOMS  °· Dark, red, or tea colored urine. °· Weakness of affected muscles. °· Weight gain from water retention. °· Joint aches and pains. °· Irregular heart from high potassium in the blood. °· Muscle tenderness or aching. °· Generalized weakness. °· Seizures. °· Feeling tired (fatigue). °DIAGNOSIS  °Your caregiver may find muscle tenderness on exam and suspect the problem. Urine tests and blood work can confirm the problem. °TREATMENT     °· Early and aggressive treatment with large amounts of fluids may help prevent kidney failure. °· Water producing medicine (diuretic) may be used to help flush the kidneys. °· High potassium and calcium problems (electrolyte) in your blood may need treatment. °HOME CARE INSTRUCTIONS  °This problem is usually cared for in a hospital. If you are allowed to go home and require dialysis, make sure you keep all appointments for lab work and dialysis. Not doing so could result in death. °Document Released: 07/19/2004 Document Revised: 10/29/2011 Document Reviewed:  01/31/2009 °Exit[employer]® Patient Information ©2015 [employer]. This information is not intended to replace advice given to you by your health care provider. Make sure you discuss any questions you have with your health care provider. ° °

## 2015-04-01 NOTE — Discharge Summary (Signed)
Name: Patrick Cole MRN: [id number] DOB: 02-Jun-1991 24 y.o. PCP: Dr. [name]  Date of Admission: 03/30/2015 10:17 PM Date of Discharge: 04/01/2015 Attending Physician: Doneen Poisson, MD  Discharge Diagnosis: 1. Rhabdomyolysis  Discharge Medications:   Medication List    STOP taking these medications        HYDROcodone-acetaminophen 5-325 MG per tablet  Commonly known as:  NORCO/VICODIN     ibuprofen 600 MG tablet  Commonly known as:  ADVIL,MOTRIN     methocarbamol 500 MG tablet  Commonly known as:  ROBAXIN        Disposition and follow-up:   PatrickPatrick Cole was discharged from [facility] in stable condition.  At the hospital follow up visit please address:   1.  Resolution of rhabdomyolysis and left foot pain.  2.  Labs / imaging needed at time of follow-up: None  3.  Pending labs/ test needing follow-up: None  Follow-up Appointments:     Follow-up Information    Follow up with [name], MD In 1 month.   Specialty:  Internal Medicine   Contact information:   [address] [place] [address] [phone]       Discharge Instructions: Discharge Instructions    Call MD for:  persistant dizziness or light-headedness    Complete by:  As directed      Call MD for:  persistant nausea and vomiting    Complete by:  As directed      Call MD for:  redness, tenderness, or signs of infection (pain, swelling, redness, odor or green/yellow discharge around incision site)    Complete by:  As directed      Call MD for:  severe uncontrolled pain    Complete by:  As directed      Call MD for:  temperature >100.4    Complete by:  As directed      Diet - low sodium heart healthy    Complete by:  As directed      Increase activity slowly    Complete by:  As directed            Procedures Performed:  Dg Foot 2 Views Right  04/01/2015   CLINICAL DATA:  Three-day history of pain and cramping.  EXAM: RIGHT FOOT - 2 VIEW  COMPARISON:   None.  FINDINGS: Frontal and lateral views obtained. No fracture or dislocation. Joint spaces appear intact. No erosive change. There is pes planus. No soft tissue lesions appreciable.  IMPRESSION: Pes planus. No fracture or dislocation. No appreciable arthropathy.   Electronically Signed   By: Bretta Bang III M.D.   On: 04/01/2015 00:43   Admission HPI: 49 Y O M with no significant PMH except for allergies which required shots in the past and tobacco abuse, presented to the ED last night, with complaints of muscle cramps and pain mostly in his lower extremities, but also in his supper extremities. Patient played football twice yesterday, under the hot weather, in the morning and evening. He says he tried to stay hydrated. He endorses good appetite, he denies chest pain, no diarrhea or vomiting. He denies dysuria or dark urine. He denies use of any illicit drugs, he occasionally drinks alcohol only when he goes to parties, he is not on any medications. Presently patient is no longer having cramps and describes only mild pain in his lower extremities.  In the ED, Pts CK levels- 7236, he was hydrated, given 3.5L of IVF, with CK  dropping to 5190. IMTS was called to admit.  Hospital Course by problem list: 1. 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 In the ED, his vital signs were stable, his creatinine kinase was elevated to 7236, and his creatinine was elevated to 1.6 from his baseline of 1.2. He received 3.5L of IV fluids and his creatinine kinase improved to 5190 so he was admitted to the internal medicine teaching service. The following morning, he reported his symptoms were improving and his creatinine kinase improved to 2990 and his creatinine decreased to 1.13.  2. Left plantar forefoot pain: On the night of his admission, Patrick Cole  complained of acute onset left foot pain and swelling that was tender to palpation over the dorsum of his medial forefoot. The pain started at practice and was steadily getting worse over the last week. The x-ray was negative for fracture, only notable for pes planus. I suspect this is a ligamental injury versus stress fracture. I recommended he rest, ice, compress, and elevate his foot.  Discharge Vitals:   BP 96/46 mmHg  Pulse 47  Temp(Src) 97.9 F (36.6 C) (Oral)  Resp 18  Ht 6\' 1"  (1.854 m)  Wt 101.379 kg (223 lb 8 oz)  BMI 29.49 kg/m2  SpO2 100%  Discharge Labs:  Results for orders placed or performed during the hospital encounter of 03/30/15 (from the past 24 hour(s))  Basic metabolic panel     Status: Abnormal   Collection Time: 04/01/15  6:31 AM  Result Value Ref Range   Sodium 139 135 - 145 mmol/L   Potassium 4.5 3.5 - 5.1 mmol/L   Chloride 107 101 - 111 mmol/L   CO2 25 22 - 32 mmol/L   Glucose, Bld 89 65 - 99 mg/dL   BUN 7 6 - 20 mg/dL   Creatinine, Ser 4.09 0.61 - 1.24 mg/dL   Calcium 8.3 (L) 8.9 - 10.3 mg/dL   GFR calc non Af Amer >60 >60 mL/min   GFR calc Af Amer >60 >60 mL/min   Anion gap 7 5 - 15  CK     Status: Abnormal   Collection Time: 04/01/15  6:31 AM  Result Value Ref Range   Total CK 2990 (H) 49 - 397 U/L    Signed: [name], MD 04/01/2015, 11:49 AM

## 2015-04-01 NOTE — Progress Notes (Signed)
Internal Medicine Attending  Date: 04/01/2015  Patient name: Patrick Cole Medical record number: [id number] Date of birth: 30-Jan-1991 Age: 24 y.o. Gender: male  I saw and evaluated the patient. I reviewed the resident's note by Dr. Earnest Conroy and I agree with the resident's findings and plans as documented in his progress note.  Please see my H&P dated 04/01/2015 and attached to Dr. Fredirick Lathe H&P dated 03/31/2015 for the specifics of my evaluation, assessment, and plan from earlier today.

## 2015-04-01 NOTE — Care Management Note (Signed)
Case Management Note  Patient Details  Name: Patrick Cole MRN: [id number] Date of Birth: 1990-12-12  Subjective/Objective:    Patient is for dc today, will follow up with internal med clinic.  Patient has not been prescribed any meds.  No needs.                Action/Plan:   Expected Discharge Date:                  Expected Discharge Plan:  Home/Self Care  In-House Referral:     Discharge planning Services  CM Consult  Post Acute Care Choice:    Choice offered to:     DME Arranged:    DME Agency:     HH Arranged:    HH Agency:     Status of Service:  Completed, signed off  Medicare Important Message Given:    Date Medicare IM Given:    Medicare IM give by:    Date Additional Medicare IM Given:    Additional Medicare Important Message give by:     If discussed at Long Length of Stay Meetings, dates discussed:    Additional Comments:  Leone Haven, RN 04/01/2015, 11:34 AM

## 2015-04-06 ENCOUNTER — Telehealth: Payer: Self-pay | Admitting: Internal Medicine

## 2015-04-06 NOTE — Telephone Encounter (Signed)
Call to patient to follow up from hospital stay and to verify pt is aware of appointment for 04/14/15 at 10:15 lmtcb to confirm and if pt has any questions

## 2015-04-14 ENCOUNTER — Ambulatory Visit: Payer: BLUE CROSS/BLUE SHIELD | Admitting: Internal Medicine

## 2015-04-14 ENCOUNTER — Encounter: Payer: Self-pay | Admitting: General Practice

## 2017-03-02 IMAGING — CR DG FOOT 2V*R*
2 series · 2 of 2 positions shown · non-contrast
Comparison: None.

CLINICAL DATA: Three-day history of pain and cramping.

EXAM:
RIGHT FOOT - 2 VIEW

[AP]
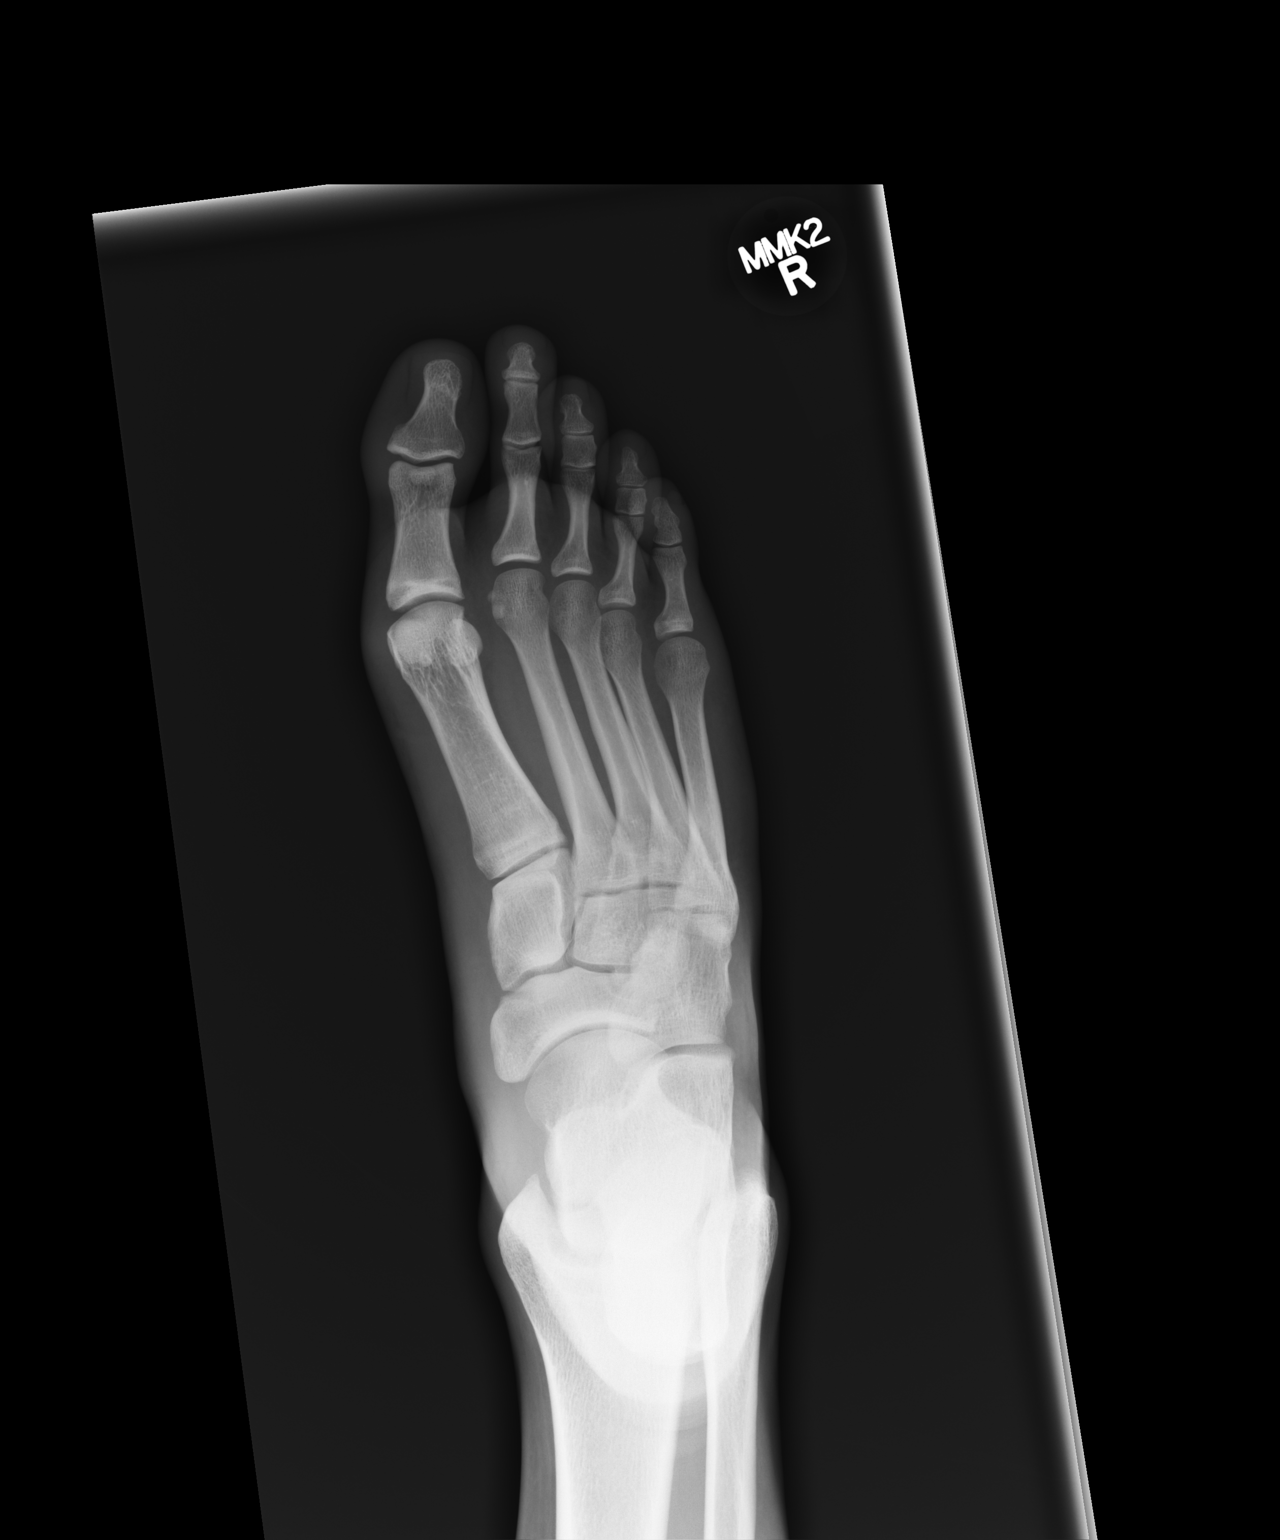

[lateral]
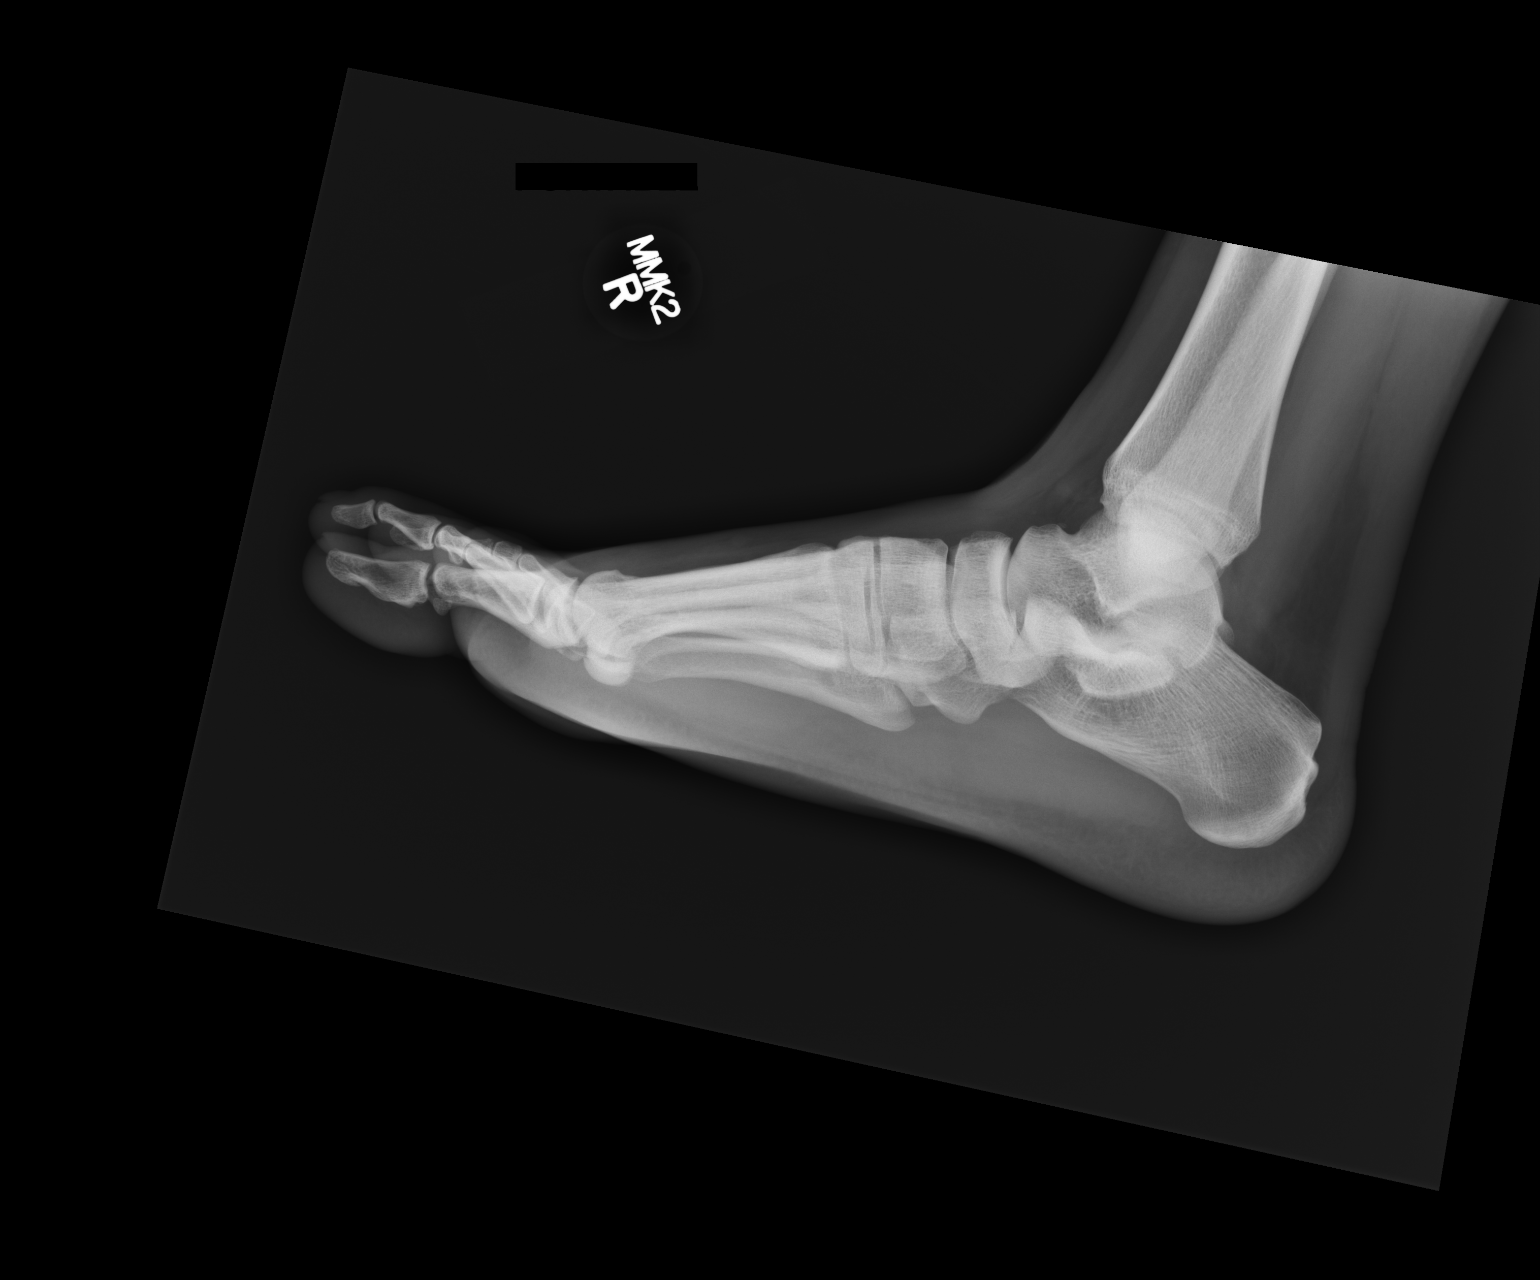

[2 of 2 positions shown; findings below may reference images not displayed]

FINDINGS: Frontal and lateral views obtained. No fracture or dislocation.
Joint spaces appear intact. No erosive change. There is pes planus.
No soft tissue lesions appreciable.
IMPRESSION: Pes planus. No fracture or dislocation. No appreciable arthropathy.
